# Patient Record
Sex: Male | Born: 1965 | Race: White | Hispanic: Yes | Smoking: Never smoker
Health system: Southern US, Community
[De-identification: ages and names within clinical notes are randomized; demographics above are authoritative.]

## PROBLEM LIST (undated history)

## (undated) DIAGNOSIS — E119 Type 2 diabetes mellitus without complications: Secondary | ICD-10-CM

## (undated) DIAGNOSIS — N419 Inflammatory disease of prostate, unspecified: Secondary | ICD-10-CM

## (undated) DIAGNOSIS — F1910 Other psychoactive substance abuse, uncomplicated: Secondary | ICD-10-CM

## (undated) DIAGNOSIS — F419 Anxiety disorder, unspecified: Secondary | ICD-10-CM

## (undated) DIAGNOSIS — F1490 Cocaine use, unspecified, uncomplicated: Secondary | ICD-10-CM

## (undated) DIAGNOSIS — K824 Cholesterolosis of gallbladder: Secondary | ICD-10-CM

## (undated) DIAGNOSIS — E785 Hyperlipidemia, unspecified: Secondary | ICD-10-CM

## (undated) DIAGNOSIS — N400 Enlarged prostate without lower urinary tract symptoms: Secondary | ICD-10-CM

## (undated) DIAGNOSIS — M1990 Unspecified osteoarthritis, unspecified site: Secondary | ICD-10-CM

## (undated) DIAGNOSIS — R7303 Prediabetes: Secondary | ICD-10-CM

## (undated) DIAGNOSIS — K219 Gastro-esophageal reflux disease without esophagitis: Secondary | ICD-10-CM

## (undated) HISTORY — DX: Inflammatory disease of prostate, unspecified: N41.9

## (undated) HISTORY — DX: Hyperlipidemia, unspecified: E78.5

## (undated) HISTORY — DX: Other psychoactive substance abuse, uncomplicated: F19.10

## (undated) HISTORY — DX: Gastro-esophageal reflux disease without esophagitis: K21.9

## (undated) HISTORY — DX: Prediabetes: R73.03

## (undated) HISTORY — DX: Unspecified osteoarthritis, unspecified site: M19.90

## (undated) HISTORY — DX: Cholesterolosis of gallbladder: K82.4

## (undated) HISTORY — DX: Cocaine use, unspecified, uncomplicated: F14.90

## (undated) HISTORY — DX: Type 2 diabetes mellitus without complications: E11.9

## (undated) HISTORY — DX: Benign prostatic hyperplasia without lower urinary tract symptoms: N40.0

## (undated) HISTORY — DX: Anxiety disorder, unspecified: F41.9

---

## 2005-05-23 ENCOUNTER — Emergency Department (HOSPITAL_COMMUNITY): Admission: EM | Admit: 2005-05-23 | Discharge: 2005-05-23 | Payer: Self-pay | Admitting: Emergency Medicine

## 2006-03-04 ENCOUNTER — Emergency Department (HOSPITAL_COMMUNITY): Admission: EM | Admit: 2006-03-04 | Discharge: 2006-03-04 | Payer: Self-pay | Admitting: Emergency Medicine

## 2011-08-08 ENCOUNTER — Other Ambulatory Visit: Payer: Self-pay | Admitting: Specialist

## 2011-08-08 DIAGNOSIS — R1011 Right upper quadrant pain: Secondary | ICD-10-CM

## 2011-08-11 ENCOUNTER — Other Ambulatory Visit: Payer: Self-pay

## 2011-08-14 ENCOUNTER — Ambulatory Visit
Admission: RE | Admit: 2011-08-14 | Discharge: 2011-08-14 | Disposition: A | Discharge status: Home / Self Care | Payer: No Typology Code available for payment source | Source: Ambulatory Visit | Attending: Specialist | Admitting: Specialist

## 2011-08-14 ENCOUNTER — Other Ambulatory Visit: Payer: Self-pay | Admitting: Specialist

## 2011-08-14 DIAGNOSIS — R1011 Right upper quadrant pain: Secondary | ICD-10-CM

## 2011-09-04 ENCOUNTER — Other Ambulatory Visit: Payer: Self-pay | Admitting: Specialist

## 2011-09-04 DIAGNOSIS — N4289 Other specified disorders of prostate: Secondary | ICD-10-CM

## 2011-09-05 ENCOUNTER — Other Ambulatory Visit: Payer: No Typology Code available for payment source

## 2011-09-08 ENCOUNTER — Other Ambulatory Visit: Payer: No Typology Code available for payment source

## 2011-09-15 ENCOUNTER — Other Ambulatory Visit: Payer: No Typology Code available for payment source

## 2011-09-18 ENCOUNTER — Other Ambulatory Visit (HOSPITAL_COMMUNITY): Payer: Self-pay | Admitting: Specialist

## 2011-09-18 ENCOUNTER — Other Ambulatory Visit: Payer: No Typology Code available for payment source

## 2011-10-06 ENCOUNTER — Other Ambulatory Visit (HOSPITAL_COMMUNITY): Payer: Self-pay | Admitting: Adult Health

## 2011-10-06 DIAGNOSIS — R1032 Left lower quadrant pain: Secondary | ICD-10-CM

## 2011-10-09 ENCOUNTER — Inpatient Hospital Stay (HOSPITAL_COMMUNITY): Admission: RE | Admit: 2011-10-09 | Payer: No Typology Code available for payment source | Source: Ambulatory Visit

## 2011-10-16 ENCOUNTER — Inpatient Hospital Stay (HOSPITAL_COMMUNITY)
Admission: RE | Admit: 2011-10-16 | Discharge: 2011-10-16 | Payer: No Typology Code available for payment source | Source: Ambulatory Visit | Attending: Adult Health | Admitting: Adult Health

## 2013-07-31 IMAGING — US US ABDOMEN LIMITED
1 series · 14 of 25 positions shown · non-contrast
Comparison: None.

CLINICAL DATA: Right upper quadrant pain

LIMITED ABDOMINAL ULTRASOUND - RIGHT UPPER QUADRANT

[Series 1: us abdomen limited · 0.17mm/px · 14 of 68 slices shown]
[im 1/68]
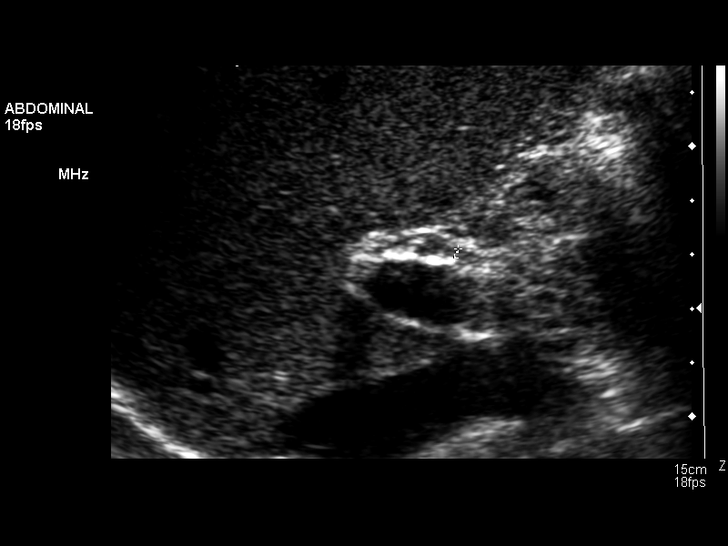
[im 6/68]
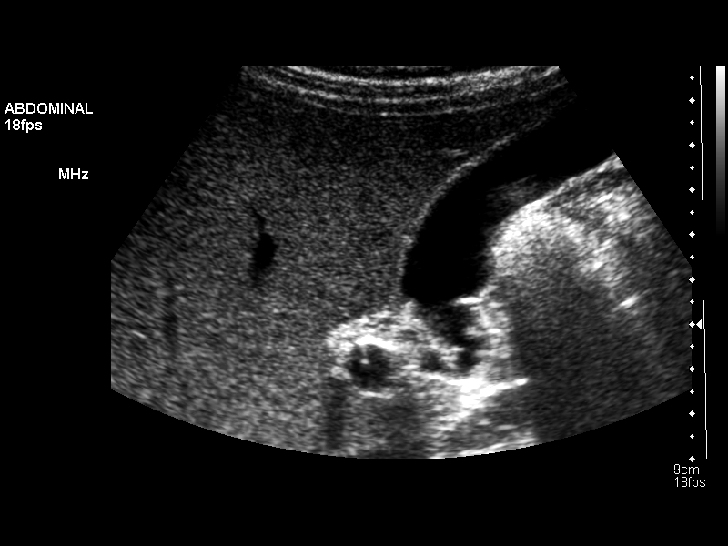
[im 12/68]
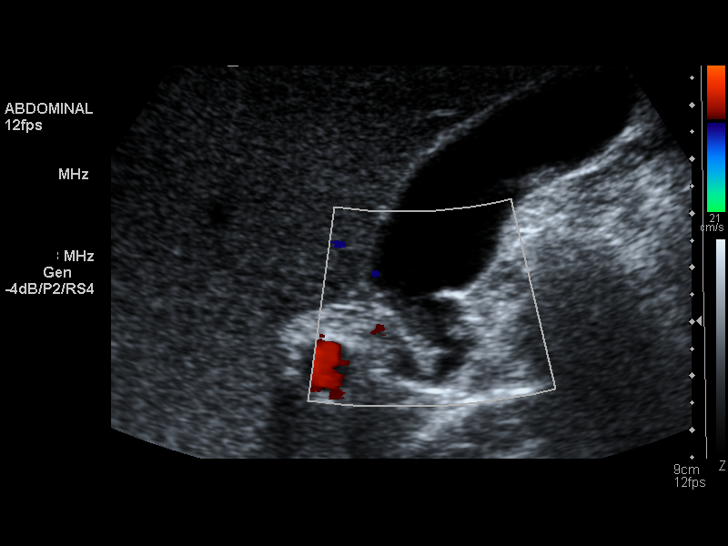
[im 17/68]
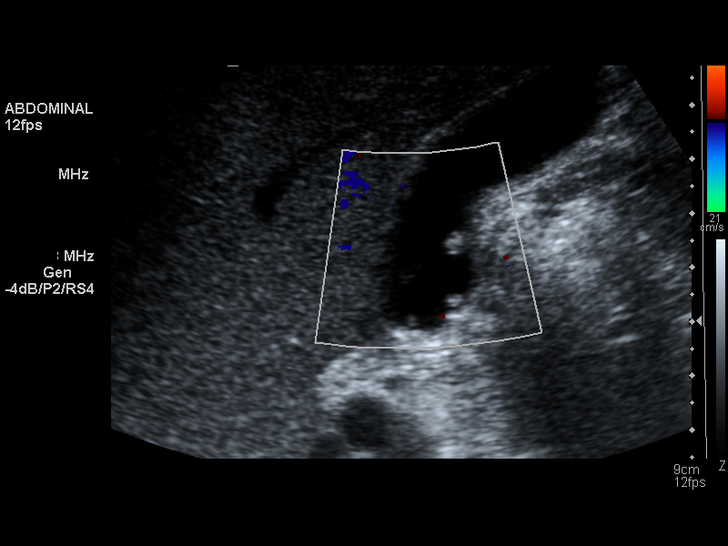
[im 23/68]
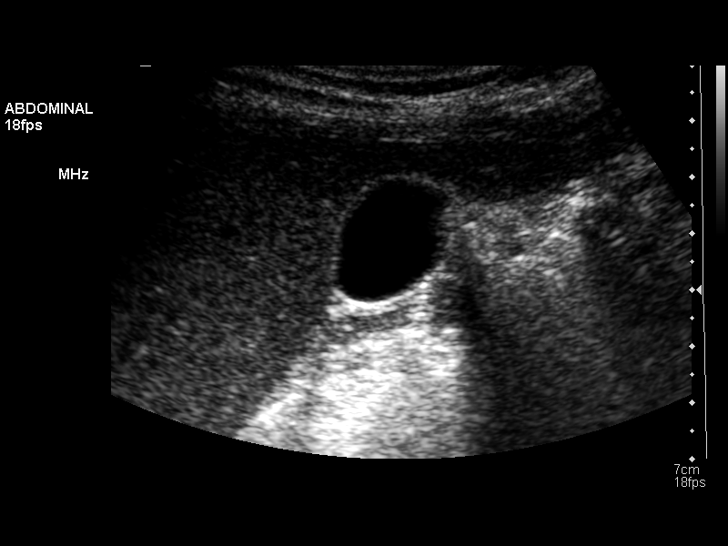
[im 26/68]
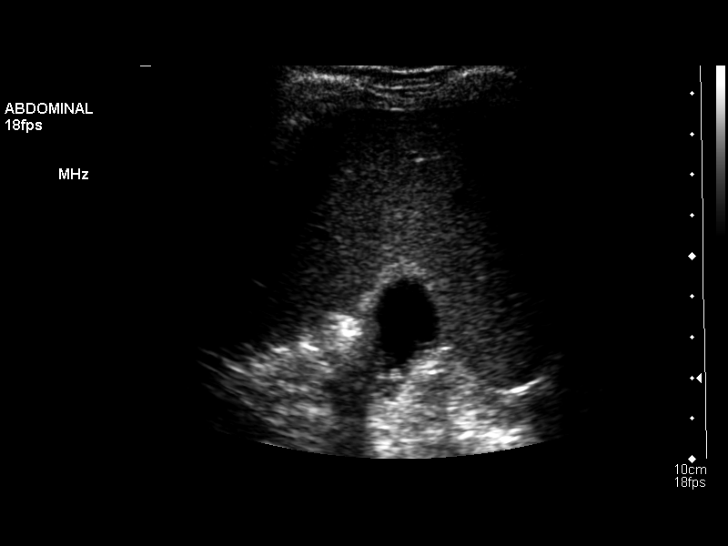
[im 31/68]
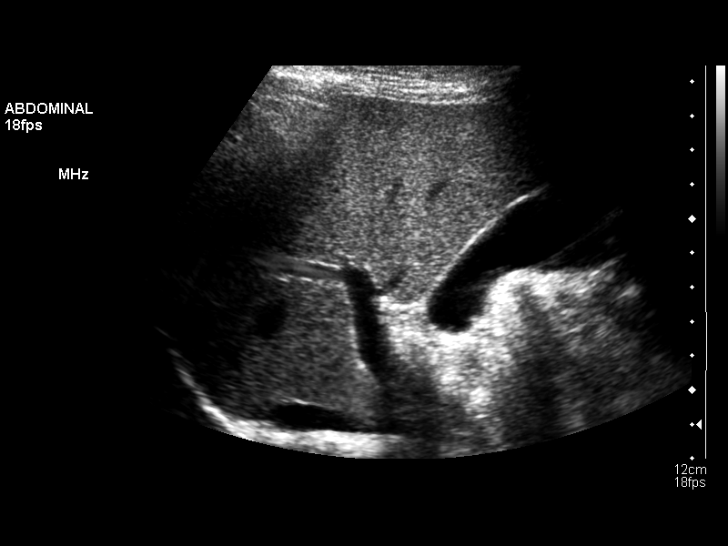
[im 37/68]
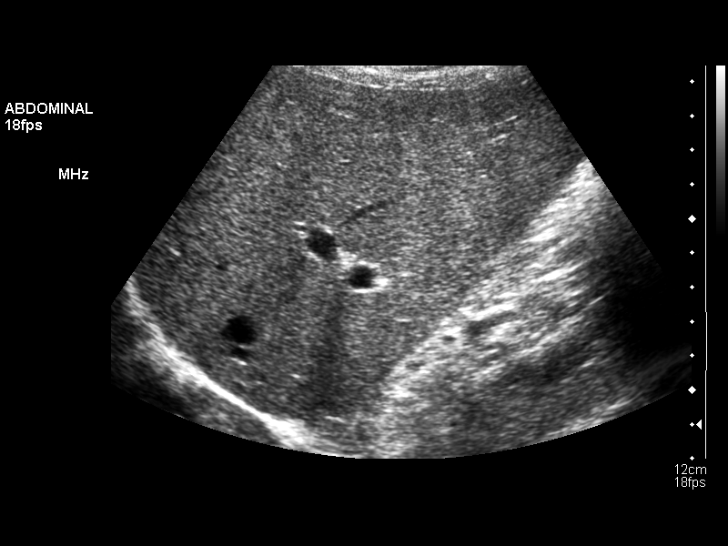
[im 42/68]
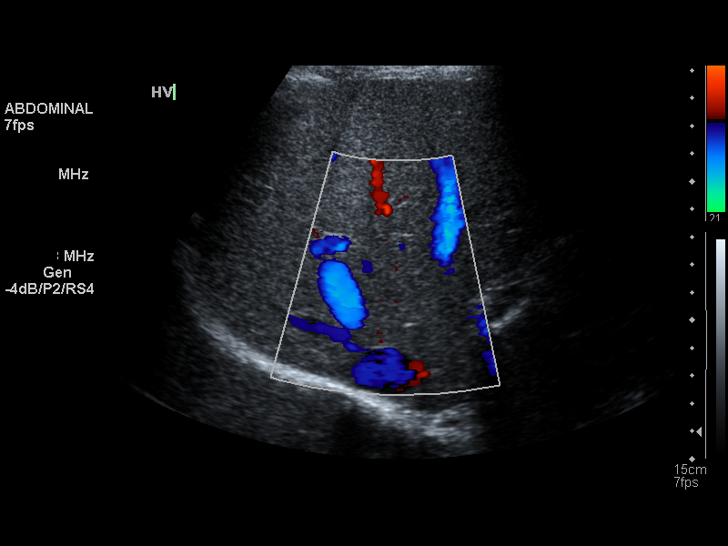
[im 45/68]
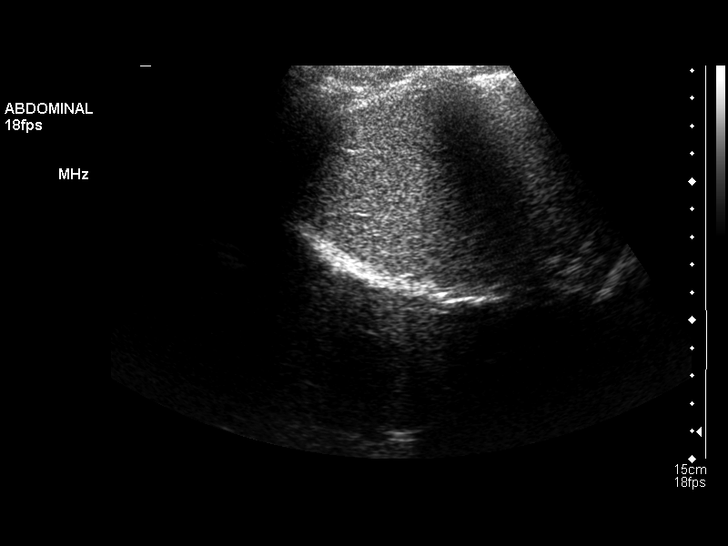
[im 51/68]
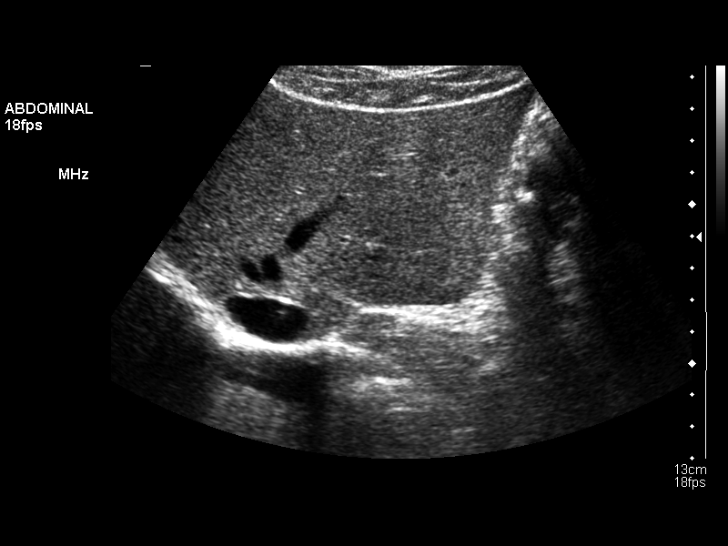
[im 56/68]
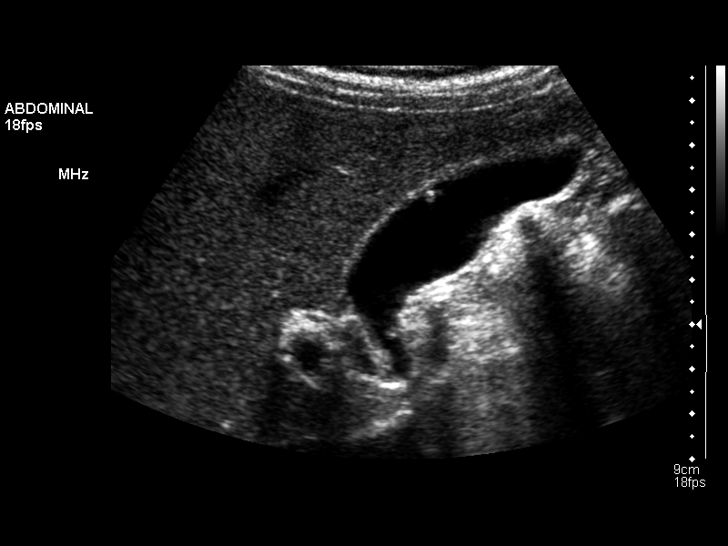
[im 62/68]
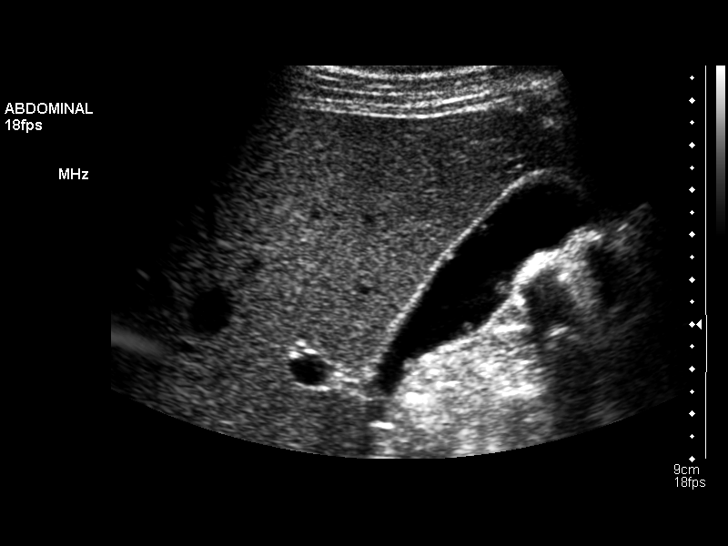
[im 68/68]
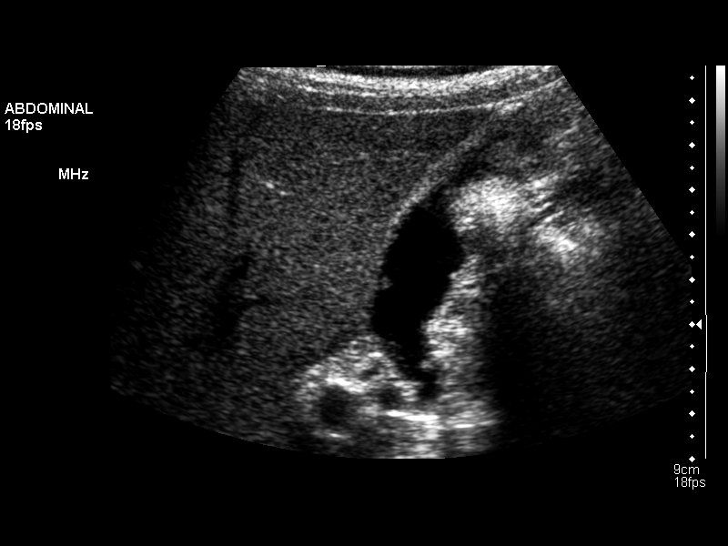

[14 of 25 positions shown; findings below may reference images not displayed]

FINDINGS: Gallbladder:  There are multiple nonshadowing non mobile echogenic
foci within the gallbladder which are most typical of polyps no
larger than 4 mm in diameter.  No gallstones are seen.  There is no
pain over the gallbladder with compression.

Common bile duct:  The common bile duct is normal measuring 1.8 mm
in diameter.

Liver:  The liver is only slightly inhomogeneous.  No focal hepatic
lesion is seen.
IMPRESSION: 1.  Multiple gallbladder polyps of no larger than 4 mm in diameter.
No gallstones or pain over the gallbladder.
2.  Slightly inhomogeneous liver.

## 2018-02-08 ENCOUNTER — Encounter: Payer: Self-pay | Admitting: Gastroenterology

## 2018-03-08 ENCOUNTER — Encounter: Payer: Self-pay | Admitting: Gastroenterology

## 2018-03-20 ENCOUNTER — Encounter: Payer: Self-pay | Admitting: Gastroenterology

## 2018-03-26 ENCOUNTER — Ambulatory Visit (AMBULATORY_SURGERY_CENTER): Payer: Self-pay

## 2018-03-26 VITALS — Ht 63.0 in | Wt 136.4 lb

## 2018-03-26 DIAGNOSIS — Z1211 Encounter for screening for malignant neoplasm of colon: Secondary | ICD-10-CM

## 2018-03-26 NOTE — Progress Notes (Signed)
Julia-the interpretor was with the pt for his PV prior to his colon on 04/09/18.  During the PV, the  pt states he has been having left lower abd pain, diarrhea for over 5 months and is complaining of a fever, dizziness and  nausea  today. The pt was being treated for a bladder infection with Septra, but stopped the medication over 3 weeks ago. Advised pt he needs to be evaluated by the doctor due to a possible infection. The pt wants to go back to see Dr Marcellina Millin PA. Colon on 04/09/18 was cx'd and the PV was not completed today. The pt understood. Gregary Signs, the interpretor informed pt to schedule a doctors appointment with his PCP. He will reschedule the colon after he has been medically cleared by his PCP. Gwyndolyn Saxon, RN

## 2018-04-09 ENCOUNTER — Encounter: Payer: Self-pay | Admitting: Gastroenterology

## 2018-10-13 ENCOUNTER — Emergency Department (HOSPITAL_COMMUNITY): Payer: Self-pay

## 2018-10-13 ENCOUNTER — Emergency Department (HOSPITAL_COMMUNITY)
Admission: EM | Admit: 2018-10-13 | Discharge: 2018-10-13 | Disposition: A | Discharge status: Home / Self Care | Payer: Self-pay | Attending: Emergency Medicine | Admitting: Emergency Medicine

## 2018-10-13 ENCOUNTER — Other Ambulatory Visit: Payer: Self-pay

## 2018-10-13 ENCOUNTER — Encounter (HOSPITAL_COMMUNITY): Payer: Self-pay | Admitting: Student

## 2018-10-13 DIAGNOSIS — R42 Dizziness and giddiness: Secondary | ICD-10-CM | POA: Insufficient documentation

## 2018-10-13 DIAGNOSIS — F1410 Cocaine abuse, uncomplicated: Secondary | ICD-10-CM | POA: Insufficient documentation

## 2018-10-13 DIAGNOSIS — R0600 Dyspnea, unspecified: Secondary | ICD-10-CM | POA: Insufficient documentation

## 2018-10-13 DIAGNOSIS — R002 Palpitations: Secondary | ICD-10-CM | POA: Insufficient documentation

## 2018-10-13 LAB — CBC
HEMATOCRIT: 46.5 % (ref 39.0–52.0)
HEMOGLOBIN: 15.1 g/dL (ref 13.0–17.0)
MCH: 29.7 pg (ref 26.0–34.0)
MCHC: 32.5 g/dL (ref 30.0–36.0)
MCV: 91.4 fL (ref 80.0–100.0)
NRBC: 0 % (ref 0.0–0.2)
Platelets: 276 10*3/uL (ref 150–400)
RBC: 5.09 MIL/uL (ref 4.22–5.81)
RDW: 13.6 % (ref 11.5–15.5)
WBC: 9.9 10*3/uL (ref 4.0–10.5)

## 2018-10-13 LAB — BASIC METABOLIC PANEL
Anion gap: 8 (ref 5–15)
BUN: 9 mg/dL (ref 6–20)
CALCIUM: 8.7 mg/dL — AB (ref 8.9–10.3)
CO2: 23 mmol/L (ref 22–32)
Chloride: 106 mmol/L (ref 98–111)
Creatinine, Ser: 0.92 mg/dL (ref 0.61–1.24)
GFR calc non Af Amer: >60 mL/min (ref >60)
GLUCOSE: 107 mg/dL — AB (ref 70–99)
Potassium: 3.9 mmol/L (ref 3.5–5.1)
Sodium: 137 mmol/L (ref 135–145)

## 2018-10-13 MED ORDER — SODIUM CHLORIDE 0.9 % IV BOLUS
1000.0000 mL | Freq: Once | INTRAVENOUS | Status: AC
  Administered 2018-10-13: 1000 mL via INTRAVENOUS

## 2018-10-13 NOTE — Discharge Instructions (Addendum)
Please do not use alcohol or cocaine or other illicit drugs Your work  up here is normal Recheck with your doctor as needed

## 2018-10-13 NOTE — ED Triage Notes (Signed)
Per ems, pt was driving, became SOB and felt heart fluttering. Pulled over and called 911. PT also reports  Tingling to right and left upper extremities

## 2018-10-17 NOTE — ED Provider Notes (Signed)
[facility] DEPARTMENT Provider Note   CSN: [id number] Arrival date & time: 10/13/18  1526    History   Chief Complaint Chief Complaint  Patient presents with  . Shortness of Breath    HPI Anthony Stanley is a 53 y.o. male.     HPI 53 yo male brought in by ems with complaints of palpitations while driving.  He felt like his heart was racing with some associated dyspnea.  He pulled over and felt lightheaded.  He has had some episodes in the past but they resolved prior to seeking medical care. EMS was called and transported patient.  Patient denies chest pain, cough, exposure to covid or travel. He endorses alcohol and cocaine last night, but not today.  This has affected his ability to eat and drink.  History reviewed. No pertinent past medical history.  There are no active problems to display for this patient.   History reviewed. No pertinent surgical history.      Home Medications    Prior to Admission medications   Not on File    Family History History reviewed. No pertinent family history.  Social History Social History   Tobacco Use  . Smoking status: Never Smoker  . Smokeless tobacco: Never Used  Substance Use Topics  . Alcohol use: Yes    Alcohol/week: 10.0 standard drinks    Types: 10 Cans of beer per week  . Drug use: Not on file     Allergies   Patient has no known allergies.   Review of Systems Review of Systems  All other systems reviewed and are negative.    Physical Exam Updated Vital Signs BP 131/88 (BP Location: Right Arm)   Pulse 82   Temp 98.4 F (36.9 C) (Oral)   Resp 16   Ht 1.651 m (5\' 5" )   Wt 61.2 kg   SpO2 98%   BMI 22.47 kg/m   Physical Exam Vitals signs and nursing note reviewed.  Constitutional:      Appearance: He is well-developed.  HENT:     Head: Normocephalic.     Mouth/Throat:     Mouth: Mucous membranes are moist.  Eyes:     Pupils: Pupils are equal, round, and  reactive to light.  Neck:     Musculoskeletal: Normal range of motion.  Cardiovascular:     Rate and Rhythm: Normal rate and regular rhythm.  Pulmonary:     Effort: Pulmonary effort is normal.  Abdominal:     Palpations: Abdomen is soft.  Musculoskeletal: Normal range of motion.  Skin:    General: Skin is warm and dry.  Neurological:     General: No focal deficit present.     Mental Status: He is alert.  Psychiatric:        Mood and Affect: Mood normal.      ED Treatments / Results  Labs (all labs ordered are listed, but only abnormal results are displayed) Labs Reviewed  BASIC METABOLIC PANEL - Abnormal; Notable for the following components:      Result Value   Glucose, Bld 107 (*)    Calcium 8.7 (*)    All other components within normal limits  CBC    EKG EKG Interpretation  Date/Time:  Sunday October 13 2018 15:34:46 EDT Ventricular Rate:  87 PR Interval:  138 QRS Duration: 76 QT Interval:  352 QTC Calculation: 423 R Axis:   25 Text Interpretation:  Normal sinus rhythm Normal ECG Confirmed by Jaydence Vanyo,  Andee Poles [phone]) on 10/13/2018 3:56:36 PM   Radiology No results found.  Procedures Procedures (including critical care time)  Medications Ordered in ED Medications  sodium chloride 0.9 % bolus 1,000 mL (0 mLs Intravenous Stopped 10/13/18 1757)     Initial Impression / Assessment and Plan / ED Course  I have reviewed the triage vital signs and the nursing notes.  Pertinent labs & imaging results that were available during my care of the patient were reviewed by me and considered in my medical decision making (see chart for details).         Final Clinical Impressions(s) / ED Diagnoses   Final diagnoses:  Palpitations  Cocaine abuse [facility])    ED Discharge Orders    None       Pattricia Boss, MD 10/17/18 1038

## 2019-06-11 ENCOUNTER — Encounter: Payer: Self-pay | Admitting: Physician Assistant

## 2019-06-18 ENCOUNTER — Encounter: Payer: Self-pay | Admitting: *Deleted

## 2019-06-27 ENCOUNTER — Ambulatory Visit (INDEPENDENT_AMBULATORY_CARE_PROVIDER_SITE_OTHER): Payer: Self-pay | Admitting: Physician Assistant

## 2019-06-27 ENCOUNTER — Other Ambulatory Visit (INDEPENDENT_AMBULATORY_CARE_PROVIDER_SITE_OTHER): Payer: Self-pay

## 2019-06-27 ENCOUNTER — Ambulatory Visit (INDEPENDENT_AMBULATORY_CARE_PROVIDER_SITE_OTHER): Payer: Self-pay

## 2019-06-27 ENCOUNTER — Encounter: Payer: Self-pay | Admitting: Physician Assistant

## 2019-06-27 ENCOUNTER — Other Ambulatory Visit: Payer: Self-pay

## 2019-06-27 VITALS — BP 130/90 | HR 90 | Temp 98.5°F | Ht 62.0 in | Wt 126.0 lb

## 2019-06-27 DIAGNOSIS — Z1159 Encounter for screening for other viral diseases: Secondary | ICD-10-CM

## 2019-06-27 DIAGNOSIS — K5900 Constipation, unspecified: Secondary | ICD-10-CM

## 2019-06-27 DIAGNOSIS — R1032 Left lower quadrant pain: Secondary | ICD-10-CM

## 2019-06-27 DIAGNOSIS — Z1211 Encounter for screening for malignant neoplasm of colon: Secondary | ICD-10-CM

## 2019-06-27 LAB — CBC WITH DIFFERENTIAL/PLATELET
Basophils Absolute: 0 10*3/uL (ref 0.0–0.1)
Basophils Relative: 0.5 % (ref 0.0–3.0)
Eosinophils Absolute: 0 10*3/uL (ref 0.0–0.7)
Eosinophils Relative: 0.6 % (ref 0.0–5.0)
HCT: 48.6 % (ref 39.0–52.0)
Hemoglobin: 16.4 g/dL (ref 13.0–17.0)
Lymphocytes Relative: 23.7 % (ref 12.0–46.0)
Lymphs Abs: 1.9 10*3/uL (ref 0.7–4.0)
MCHC: 33.7 g/dL (ref 30.0–36.0)
MCV: 91.2 fl (ref 78.0–100.0)
Monocytes Absolute: 0.4 10*3/uL (ref 0.1–1.0)
Monocytes Relative: 5.6 % (ref 3.0–12.0)
Neutro Abs: 5.5 10*3/uL (ref 1.4–7.7)
Neutrophils Relative %: 69.6 % (ref 43.0–77.0)
Platelets: 273 10*3/uL (ref 150.0–400.0)
RBC: 5.33 Mil/uL (ref 4.22–5.81)
RDW: 13.5 % (ref 11.5–15.5)
WBC: 7.8 10*3/uL (ref 4.0–10.5)

## 2019-06-27 LAB — COMPREHENSIVE METABOLIC PANEL
ALT: 28 U/L (ref 0–53)
AST: 17 U/L (ref 0–37)
Albumin: 4.6 g/dL (ref 3.5–5.2)
Alkaline Phosphatase: 76 U/L (ref 39–117)
BUN: 16 mg/dL (ref 6–23)
CO2: 26 mEq/L (ref 19–32)
Calcium: 9.6 mg/dL (ref 8.4–10.5)
Chloride: 103 mEq/L (ref 96–112)
Creatinine, Ser: 0.85 mg/dL (ref 0.40–1.50)
GFR: 94.49 mL/min (ref >60.00)
Glucose, Bld: 103 mg/dL — ABNORMAL HIGH (ref 70–99)
Potassium: 4.3 mEq/L (ref 3.5–5.1)
Sodium: 139 mEq/L (ref 135–145)
Total Bilirubin: 0.5 mg/dL (ref 0.2–1.2)
Total Protein: 7.9 g/dL (ref 6.0–8.3)

## 2019-06-27 LAB — SARS CORONAVIRUS 2 (TAT 6-24 HRS): SARS Coronavirus 2: NEGATIVE

## 2019-06-27 NOTE — Patient Instructions (Addendum)
You have been scheduled for a colonoscopy. Please follow written instructions given to you at your visit today.  Please pick up your prep supplies at the pharmacy within the next 1-3 days. If you use inhalers (even only as needed), please bring them with you on the day of your procedure.  Please purchase the following medications over the counter and take as directed: Miralax 17 grams (1 capful) dissolved in 8 ounces of water/juice once daily for bowels  Your provider has requested that you go to the basement level for lab work before leaving today. Press "B" on the elevator. The lab is located at the first door on the left as you exit the elevator.  If you are age 80 or older, your body mass index should be between 23-30. Your Body mass index is 23.05 kg/m. If this is out of the aforementioned range listed, please consider follow up with your Primary Care Provider.  If you are age 72 or younger, your body mass index should be between 19-25. Your Body mass index is 23.05 kg/m. If this is out of the aformentioned range listed, please consider follow up with your Primary Care Provider.  ________________________________________________________________ Anthony Stanley le ha programado una colonoscopia. Siga las instrucciones escritas que se le dieron en su visita de hoy. Recoja sus suministros de preparacin en la farmacia dentro de los prximos 1-3 das. Si [place] inhaladores (aunque solo sea necesario), trigalos el da de su procedimiento.  Compre los siguientes medicamentos sin receta y tmelos segn las indicaciones: Miralax 17 gramos (1 tapn) disueltos en 8 onzas de agua / jugo una vez al da para los intestinos  Su proveedor ha solicitado que vaya al nivel del stano para realizar anlisis de laboratorio antes de salir hoy. Presione "B" en el ascensor. El laboratorio est ubicado en la primera puerta a la izquierda al salir del Materials engineer.  Si tiene 65 aos o ms, su ndice de [employer] corporal debe estar  entre 23-30. Su ndice de masa corporal es de 23,05 kg / m. Si esto est fuera del rango mencionado anteriormente, considere Optometrist un seguimiento con su Proveedor de Midwife.  Si tiene 39 aos o menos, su ndice de [employer] corporal debe estar entre 19-25. Su ndice de masa corporal es de 23,05 kg / m. Si esto est fuera del rango mencionado anteriormente, considere hacer un seguimiento con su Proveedor de Midwife.

## 2019-06-27 NOTE — Progress Notes (Signed)
Subjective:    Patient ID: Anthony Stanley, male    DOB: 02-Jan-1966, 53 y.o.   MRN: [id number]  HPI [name] is a pleasant 53 year old Hispanic male, new to GI today, self-referred for evaluation of constipation and lower abdominal pain.  Patient has not had any prior GI evaluation. He says symptoms initially started about 2 years ago with intermittent left lower abdominal discomfort.  Over the past 3 to 4 weeks he has had more constant discomfort in the left lower quadrant which she describes as pressure-like or bloating in nature.  This is not been severe.  No associated fever or chills.  Appetite has been okay, weight has been stable.  He does feel that his abdominal discomfort is worse postprandially if he eats a lot.  He is also developed constipation and may go a couple of days between bowel movements.  Occasionally also has loose stools he has not noted any melena or hematochezia. No prior abdominal surgery. Family history negative for colon cancer polyps and IBD as far as he is aware, does not know paternal history. He had been given Colace to take for constipation but has not found that helpful.  Review of Systems Pertinent positive and negative review of systems were noted in the above HPI section.  All other review of systems was otherwise negative.  Outpatient Encounter Medications as of 06/27/2019  Medication Sig  . meclizine (ANTIVERT) 25 MG tablet Take 25 mg by mouth 3 (three) times daily as needed for dizziness.  . metFORMIN (GLUCOPHAGE) 500 MG tablet Take 500 mg by mouth 2 (two) times daily with a meal.  . sennosides-docusate sodium (SENOKOT-S) 8.6-50 MG tablet Take 2 tablets by mouth daily.   No facility-administered encounter medications on file as of 06/27/2019.    No Known Allergies There are no active problems to display for this patient.  Social History   Socioeconomic History  . Marital status: Legally Separated    Spouse name: Not on file  . Number of children:  Not on file  . Years of education: Not on file  . Highest education level: Not on file  Occupational History  . Not on file  Social Needs  . Financial resource strain: Not on file  . Food insecurity    Worry: Not on file    Inability: Not on file  . Transportation needs    Medical: Not on file    Non-medical: Not on file  Tobacco Use  . Smoking status: Never Smoker  . Smokeless tobacco: Never Used  Substance and Sexual Activity  . Alcohol use: Yes    Alcohol/week: 10.0 standard drinks    Types: 10 Cans of beer per week  . Drug use: Yes    Types: Cocaine    Comment: once in a while  . Sexual activity: Not on file  Lifestyle  . Physical activity    Days per week: Not on file    Minutes per session: Not on file  . Stress: Not on file  Relationships  . Social Herbalist on phone: Not on file    Gets together: Not on file    Attends religious service: Not on file    Active member of club or organization: Not on file    Attends meetings of clubs or organizations: Not on file    Relationship status: Not on file  . Intimate partner violence    Fear of current or ex partner: Not on file  Emotionally abused: Not on file    Physically abused: Not on file    Forced sexual activity: Not on file  Other Topics Concern  . Not on file  Social History Narrative  . Not on file    Mr. Hessling family history includes Heart attack in his maternal grandmother; Hypertension in his paternal grandmother; Prostate cancer in his maternal grandfather.      Objective:    Vitals:   06/27/19 0923  BP: 130/90  Pulse: 90  Temp: 98.5 F (36.9 C)    Physical Exam Well-developed well-nourished Hispanic male in no acute distress.Marland Kitchen  He is accompanied by an interpreter to understand some [place].  Height, Weight 126, BMI 23.0  HEENT; nontraumatic normocephalic, EOMI, PER R LA, sclera anicteric. Oropharynx; not examined/mask/Covid Neck; supple, no JVD Cardiovascular;  regular rate and rhythm with S1-S2, no murmur rub or gallop Pulmonary; Clear bilaterally Abdomen; soft, , nondistended, he is mildly tender in the left lower quadrant no guarding or rebound no palpable mass, no palpable mass or hepatosplenomegaly, bowel sounds are active Rectal; not done today Skin; benign exam, no jaundice rash or appreciable lesions Extremities; no clubbing cyanosis or edema skin warm and dry Neuro/Psych; alert and oriented x4, grossly nonfocal mood and affect appropriate       Assessment & Plan:   #2 53 year old Hispanic male with 2-year history of constipation and intermittent lower abdominal discomfort who comes in now with 3 to 4-week history of more constant lower abdominal discomfort.  He is mildly tender in the left lower quadrant. Etiology of symptoms is not clear, rule out colon neoplasm, rule out symptomatic diverticular disease, rule out functional constipation/IBS  Plan; CBC and c-Met Start MiraLAX 17 g in 8 ounces of water on a daily basis, if he finds this to be too much can back off to every other day dosing. Patient be scheduled for colonoscopy with Dr. Ardis Hughs.  Procedure was discussed in detail with the patient including indications risks and benefits and he is agreeable to proceed. Further work-up pending results of above.  Amy S Esterwood PA-C 06/27/2019   Cc: No ref. provider found

## 2019-06-30 NOTE — Progress Notes (Signed)
I agree with the above note, plan 

## 2019-07-01 ENCOUNTER — Ambulatory Visit (AMBULATORY_SURGERY_CENTER): Payer: Self-pay | Admitting: Gastroenterology

## 2019-07-01 ENCOUNTER — Other Ambulatory Visit: Payer: Self-pay

## 2019-07-01 ENCOUNTER — Encounter: Payer: Self-pay | Admitting: Gastroenterology

## 2019-07-01 VITALS — BP 110/76 | HR 88 | Temp 98.1°F | Resp 19

## 2019-07-01 DIAGNOSIS — D123 Benign neoplasm of transverse colon: Secondary | ICD-10-CM

## 2019-07-01 DIAGNOSIS — K5730 Diverticulosis of large intestine without perforation or abscess without bleeding: Secondary | ICD-10-CM

## 2019-07-01 DIAGNOSIS — K5900 Constipation, unspecified: Secondary | ICD-10-CM

## 2019-07-01 MED ORDER — SODIUM CHLORIDE 0.9 % IV SOLN: 500.0000 mL | Freq: Once | INTRAVENOUS | Status: AC

## 2019-07-01 NOTE — Op Note (Signed)
[facility] Patient Name: Anthony Stanley Procedure Date: 07/01/2019 1:04 PM MRN: [id number] Endoscopist: Milus Banister , MD Age: 53 Referring MD:  Date of Birth: [birth year] Gender: Male Account #: [id number] Procedure:                Colonoscopy Indications:              Abdominal pain in the left lower quadrant Medicines:                Monitored Anesthesia Care Procedure:                Pre-Anesthesia Assessment:                           - Prior to the procedure, a History and Physical                            was performed, and patient medications and                            allergies were reviewed. The patient's tolerance of                            previous anesthesia was also reviewed. The risks                            and benefits of the procedure and the sedation                            options and risks were discussed with the patient.                            All questions were answered, and informed consent                            was obtained. Prior Anticoagulants: The patient has                            taken no previous anticoagulant or antiplatelet                            agents. ASA Grade Assessment: III - A patient with                            severe systemic disease. After reviewing the risks                            and benefits, the patient was deemed in                            satisfactory condition to undergo the procedure.                           After obtaining informed consent, the colonoscope  was passed under direct vision. Throughout the                            procedure, the patient's blood pressure, pulse, and                            oxygen saturations were monitored continuously. The                            Colonoscope was introduced through the anus and                            advanced to the the cecum, identified by                            appendiceal orifice  and ileocecal valve. The                            colonoscopy was performed without difficulty. The                            patient tolerated the procedure well. The quality                            of the bowel preparation was good. The ileocecal                            valve, appendiceal orifice, and rectum were                            photographed. Scope In: 1:23:12 PM Scope Out: 1:34:03 PM Scope Withdrawal Time: 0 hours 9 minutes 6 seconds  Total Procedure Duration: 0 hours 10 minutes 51 seconds  Findings:                 A 5 mm polyp was found in the transverse colon. The                            polyp was sessile. The polyp was removed with a                            cold snare. Resection and retrieval were complete.                           A few small-mouthed diverticula were found in the                            entire colon.                           The exam was otherwise without abnormality on                            direct and retroflexion views. Complications:  No immediate complications. Estimated blood loss:                            None. Estimated Blood Loss:     Estimated blood loss: none. Impression:               - One 5 mm polyp in the transverse colon, removed                            with a cold snare. Resected and retrieved.                           - Diverticulosis in the entire examined colon.                           - The examination was otherwise normal on direct                            and retroflexion views. Recommendation:           - Patient has a contact number available for                            emergencies. The signs and symptoms of potential                            delayed complications were discussed with the                            patient. Return to normal activities tomorrow.                            Written discharge instructions were provided to the                            patient.                            - Resume previous diet.                           - Continue present medications. Continue the daily                            miralax, it will help your constipation which in                            turn should help your abdominal pains.                           - Await pathology results. Milus Banister, MD 07/01/2019 1:38:12 PM This report has been signed electronically.

## 2019-07-01 NOTE — Progress Notes (Signed)
A and O x3. Report to RN. Tolerated MAC anesthesia well.

## 2019-07-01 NOTE — Patient Instructions (Signed)
YOU HAD AN ENDOSCOPIC PROCEDURE TODAY AT [facility]:   Refer to the procedure report that was given to you for any specific questions about what was found during the examination.  If the procedure report does not answer your questions, please call your gastroenterologist to clarify.  If you requested that your care partner not be given the details of your procedure findings, then the procedure report has been included in a sealed envelope for you to review at your convenience later.  YOU SHOULD EXPECT: Some feelings of bloating in the abdomen. Passage of more gas than usual.  Walking can help get rid of the air that was put into your GI tract during the procedure and reduce the bloating. If you had a lower endoscopy (such as a colonoscopy or flexible sigmoidoscopy) you may notice spotting of blood in your stool or on the toilet paper. If you underwent a bowel prep for your procedure, you may not have a normal bowel movement for a few days.  Please Note:  You might notice some irritation and congestion in your nose or some drainage.  This is from the oxygen used during your procedure.  There is no need for concern and it should clear up in a day or so.  SYMPTOMS TO REPORT IMMEDIATELY:   Following lower endoscopy (colonoscopy or flexible sigmoidoscopy):  Excessive amounts of blood in the stool  Significant tenderness or worsening of abdominal pains  Swelling of the abdomen that is new, acute  Fever of 100F or higher   For urgent or emergent issues, a gastroenterologist can be reached at any hour by calling [phone].   DIET:  We do recommend a small meal at first, but then you may proceed to your regular diet.  Drink plenty of fluids but you should avoid alcoholic beverages for 24 hours.  ACTIVITY:  You should plan to take it easy for the rest of today and you should NOT DRIVE or use heavy machinery until tomorrow (because of the sedation medicines used during the test).     FOLLOW UP: Our staff will call the number listed on your records 48-72 hours following your procedure to check on you and address any questions or concerns that you may have regarding the information given to you following your procedure. If we do not reach you, we will leave a message.  We will attempt to reach you two times.  During this call, we will ask if you have developed any symptoms of COVID 19. If you develop any symptoms (ie: fever, flu-like symptoms, shortness of breath, cough etc.) before then, please call [phone].  If you test positive for Covid 19 in the 2 weeks post procedure, please call and report this information to [place].    If any biopsies were taken you will be contacted by phone or by letter within the next 1-3 weeks.  Please call us at [phone] if you have not heard about the biopsies in 3 weeks.    SIGNATURES/CONFIDENTIALITY: You and/or your care partner have signed paperwork which will be entered into your electronic medical record.  These signatures attest to the fact that that the information above on your After Visit Summary has been reviewed and is understood.  Full responsibility of the confidentiality of this discharge information lies with you and/or your care-partner.   Resume medications. Information given on polyps and diverticulosis.

## 2019-07-01 NOTE — Progress Notes (Signed)
Cw vitals, Cm iv and LC temps.

## 2019-07-01 NOTE — Progress Notes (Signed)
Called to room to assist during endoscopic procedure.  Patient ID and intended procedure confirmed with present staff. Received instructions for my participation in the procedure from the performing physician.  

## 2019-07-03 ENCOUNTER — Telehealth: Payer: Self-pay | Admitting: *Deleted

## 2019-07-03 NOTE — Telephone Encounter (Signed)
  Follow up Call-  Call back number 07/01/2019  Post procedure Call Back phone  # [phone]  Permission to leave phone message Yes  Some recent data might be hidden     Patient questions:  Do you have a fever, pain , or abdominal swelling? No. Pain Score  0 *  Have you tolerated food without any problems? Yes.    Have you been able to return to your normal activities? Yes.    Do you have any questions about your discharge instructions: Diet   No. Medications  No. Follow up visit  No.  Do you have questions or concerns about your Care? No.  Actions: * If pain score is 4 or above: 1. No action needed, pain <4.Have you developed a fever since your procedure? no  2.   Have you had an respiratory symptoms (SOB or cough) since your procedure? no  3.   Have you tested positive for COVID 19 since your procedure no  4.   Have you had any family members/close contacts diagnosed with the COVID 19 since your procedure?  no   If yes to any of these questions please route to Joylene John, RN and Alphonsa Gin, Therapist, sports.

## 2019-07-07 ENCOUNTER — Encounter: Payer: Self-pay | Admitting: Gastroenterology

## 2020-04-18 ENCOUNTER — Encounter (HOSPITAL_COMMUNITY): Payer: Self-pay | Admitting: Emergency Medicine

## 2020-04-18 ENCOUNTER — Emergency Department (HOSPITAL_COMMUNITY): Payer: Self-pay

## 2020-04-18 ENCOUNTER — Other Ambulatory Visit: Payer: Self-pay

## 2020-04-18 ENCOUNTER — Emergency Department (HOSPITAL_COMMUNITY)
Admission: EM | Admit: 2020-04-18 | Discharge: 2020-04-18 | Disposition: A | Discharge status: Home / Self Care | Payer: Self-pay | Attending: Emergency Medicine | Admitting: Emergency Medicine

## 2020-04-18 DIAGNOSIS — R0789 Other chest pain: Secondary | ICD-10-CM | POA: Insufficient documentation

## 2020-04-18 DIAGNOSIS — E119 Type 2 diabetes mellitus without complications: Secondary | ICD-10-CM | POA: Insufficient documentation

## 2020-04-18 DIAGNOSIS — Z7984 Long term (current) use of oral hypoglycemic drugs: Secondary | ICD-10-CM | POA: Insufficient documentation

## 2020-04-18 LAB — CBC
HCT: 45.7 % (ref 39.0–52.0)
Hemoglobin: 15.5 g/dL (ref 13.0–17.0)
MCH: 31.1 pg (ref 26.0–34.0)
MCHC: 33.9 g/dL (ref 30.0–36.0)
MCV: 91.6 fL (ref 80.0–100.0)
Platelets: 270 10*3/uL (ref 150–400)
RBC: 4.99 MIL/uL (ref 4.22–5.81)
RDW: 13.9 % (ref 11.5–15.5)
WBC: 9.2 10*3/uL (ref 4.0–10.5)
nRBC: 0 % (ref 0.0–0.2)

## 2020-04-18 LAB — BASIC METABOLIC PANEL
Anion gap: 8 (ref 5–15)
BUN: 9 mg/dL (ref 6–20)
CO2: 27 mmol/L (ref 22–32)
Calcium: 8.9 mg/dL (ref 8.9–10.3)
Chloride: 104 mmol/L (ref 98–111)
Creatinine, Ser: 1.02 mg/dL (ref 0.61–1.24)
GFR calc Af Amer: >60 mL/min (ref >60)
GFR calc non Af Amer: >60 mL/min (ref >60)
Glucose, Bld: 77 mg/dL (ref 70–99)
Potassium: 4.4 mmol/L (ref 3.5–5.1)
Sodium: 139 mmol/L (ref 135–145)

## 2020-04-18 LAB — TROPONIN I (HIGH SENSITIVITY)
Troponin I (High Sensitivity): <2 ng/L (ref <18)
Troponin I (High Sensitivity): 2 ng/L (ref <18)

## 2020-04-18 LAB — ETHANOL: Alcohol, Ethyl (B): <10 mg/dL (ref <10)

## 2020-04-18 LAB — CK: Total CK: 132 U/L (ref 49–397)

## 2020-04-18 MED ORDER — SODIUM CHLORIDE 0.9 % IV BOLUS
1000.0000 mL | Freq: Once | INTRAVENOUS | Status: AC
  Administered 2020-04-18: 1000 mL via INTRAVENOUS

## 2020-04-18 NOTE — ED Provider Notes (Signed)
[facility] DEPT Provider Note   CSN: [id number] Arrival date & time: 04/18/20  1259     History Chief Complaint  Patient presents with  . Chest Pain    Anthony Stanley is a 54 y.o. male.  HPI    Patient presents with concern of an episode of bilateral hand shaking and lip numbness.  Episode began about 1 hour prior to ED arrival, has stopped prior to my evaluation.  He currently has no pain, complaints, discomfort, weakness. Patient knowledges drinking more than usual yesterday, eight beers. He denies other ingestants. He woke in his usual state of health, had no cooperative in for his episode, and did nothing in particular for symptoms to resolve.  Past Medical History:  Diagnosis Date  . Anxiety   . Arthritis   . BPH (benign prostatic hyperplasia)   . Cocaine use    per Dr Cindie Laroche office note [facility])  . Diabetes mellitus without complication ([place])   . Gallbladder polyp   . GERD (gastroesophageal reflux disease)   . Hyperlipidemia   . Prediabetes   . Prostatitis   . Substance abuse ([place])     There are no problems to display for this patient.   History reviewed. No pertinent surgical history.     Family History  Problem Relation Age of Onset  . Heart attack Maternal Grandmother   . Prostate cancer Maternal Grandfather   . Hypertension Paternal Grandmother   . Stomach cancer Neg Hx   . Esophageal cancer Neg Hx   . Colon cancer Neg Hx   . Rectal cancer Neg Hx     Social History   Tobacco Use  . Smoking status: Never Smoker  . Smokeless tobacco: Never Used  Substance Use Topics  . Alcohol use: Yes    Alcohol/week: 10.0 standard drinks    Types: 10 Cans of beer per week  . Drug use: Not Currently    Types: Cocaine    Comment: once in a while    Home Medications Prior to Admission medications   Medication Sig Start Date End Date Taking? Authorizing Provider  meclizine (ANTIVERT) 25 MG tablet  Take 25 mg by mouth 3 (three) times daily as needed for dizziness.    [provider]  metFORMIN (GLUCOPHAGE) 500 MG tablet Take 500 mg by mouth 2 (two) times daily with a meal.    [provider]  sennosides-docusate sodium (SENOKOT-S) 8.6-50 MG tablet Take 2 tablets by mouth daily.    [provider]    Allergies    Patient has no known allergies.  Review of Systems   Review of Systems  Constitutional:       Per HPI, otherwise negative  HENT:       Per HPI, otherwise negative  Respiratory:       Per HPI, otherwise negative  Cardiovascular:       Per HPI, otherwise negative  Gastrointestinal: Negative for vomiting.  Endocrine:       Negative aside from HPI  Genitourinary:       Neg aside from HPI   Musculoskeletal:       Per HPI, otherwise negative  Skin: Negative.   Neurological: Negative for syncope.    Physical Exam Updated Vital Signs BP (!) 153/88 (BP Location: Left Arm)   Pulse 82   Temp 97.9 F (36.6 C) (Oral)   Resp (!) 21   SpO2 100%   Physical Exam Vitals and nursing note reviewed.  Constitutional:      General: He is not in acute distress.    Appearance: He is well-developed.  HENT:     Head: Normocephalic and atraumatic.  Eyes:     Conjunctiva/sclera: Conjunctivae normal.  Cardiovascular:     Rate and Rhythm: Normal rate and regular rhythm.  Pulmonary:     Effort: Pulmonary effort is normal. No respiratory distress.     Breath sounds: No stridor.  Abdominal:     General: There is no distension.  Skin:    General: Skin is warm and dry.  Neurological:     General: No focal deficit present.     Mental Status: He is alert and oriented to person, place, and time.     Cranial Nerves: No cranial nerve deficit.     Motor: No weakness.     ED Results / Procedures / Treatments   Labs (all labs ordered are listed, but only abnormal results are displayed) Labs Reviewed  BASIC METABOLIC PANEL  CBC  ETHANOL  CK    TROPONIN I (HIGH SENSITIVITY)  TROPONIN I (HIGH SENSITIVITY)    EKG EKG Interpretation  Date/Time:  Sunday April 18 2020 13:18:15 EDT Ventricular Rate:  113 PR Interval:    QRS Duration: 82 QT Interval:  307 QTC Calculation: 421 R Axis:   42 Text Interpretation: Sinus tachycardia Probable left atrial enlargement RSR' in V1 or V2, probably normal variant 12 Lead; Mason-Likar NO sig change fromr March 2020 ecg No STEMI Confirmed by Octaviano Glow [phone]) on 04/18/2020 3:01:18 PM   Radiology DG Chest 2 View  Result Date: 04/18/2020 CLINICAL DATA:  Chest pain. EXAM: CHEST - 2 VIEW COMPARISON:  10/13/2018 FINDINGS: The heart size and mediastinal contours are within normal limits. Both lungs are clear. The visualized skeletal structures are unremarkable. IMPRESSION: Negative.  No active cardiopulmonary disease. Electronically Signed   By: Marlaine Hind M.D.   On: 04/18/2020 13:57    Procedures Procedures (including critical care time)  Medications Ordered in ED Medications  sodium chloride 0.9 % bolus 1,000 mL (1,000 mLs Intravenous New Bag/Given 04/18/20 1606)    ED Course  I have reviewed the triage vital signs and the nursing notes.  Pertinent labs & imaging results that were available during my care of the patient were reviewed by me and considered in my medical decision making (see chart for details).    MDM Rules/Calculators/A&P                          5:19 PM Patient in no distress, awake, alert, sitting upright.  We discussed all findings including 2 unremarkable troponin values, reassuring labs in general.  X-ray reassuring, EKG nonischemic. Some suspicion for the patient's previous symptoms being secondary to his increased alcohol consumption yesterday, no evidence for atypical ACS, PE or other acute new pathology. Patient amenable to, appropriate for discharge with outpatient follow-up. Final Clinical Impression(s) / ED Diagnoses Final diagnoses:  Atypical  chest pain     Carmin Muskrat, MD 04/18/20 1720

## 2020-04-18 NOTE — ED Triage Notes (Signed)
Per pt, states he is having CP and he feels his upper extremities shaking-no other symptoms

## 2020-04-18 NOTE — Discharge Instructions (Signed)
As discussed, your evaluation today has been largely reassuring.  But, it is important that you monitor your condition carefully, and do not hesitate to return to the ED if you develop new, or concerning changes in your condition. ? ?Otherwise, please follow-up with your physician for appropriate ongoing care. ? ?

## 2022-04-05 IMAGING — CR DG CHEST 2V
2 series · 2 of 2 positions shown · non-contrast
Comparison: 10/13/2018

CLINICAL DATA: Chest pain.

EXAM:
CHEST - 2 VIEW

[w chest pa]
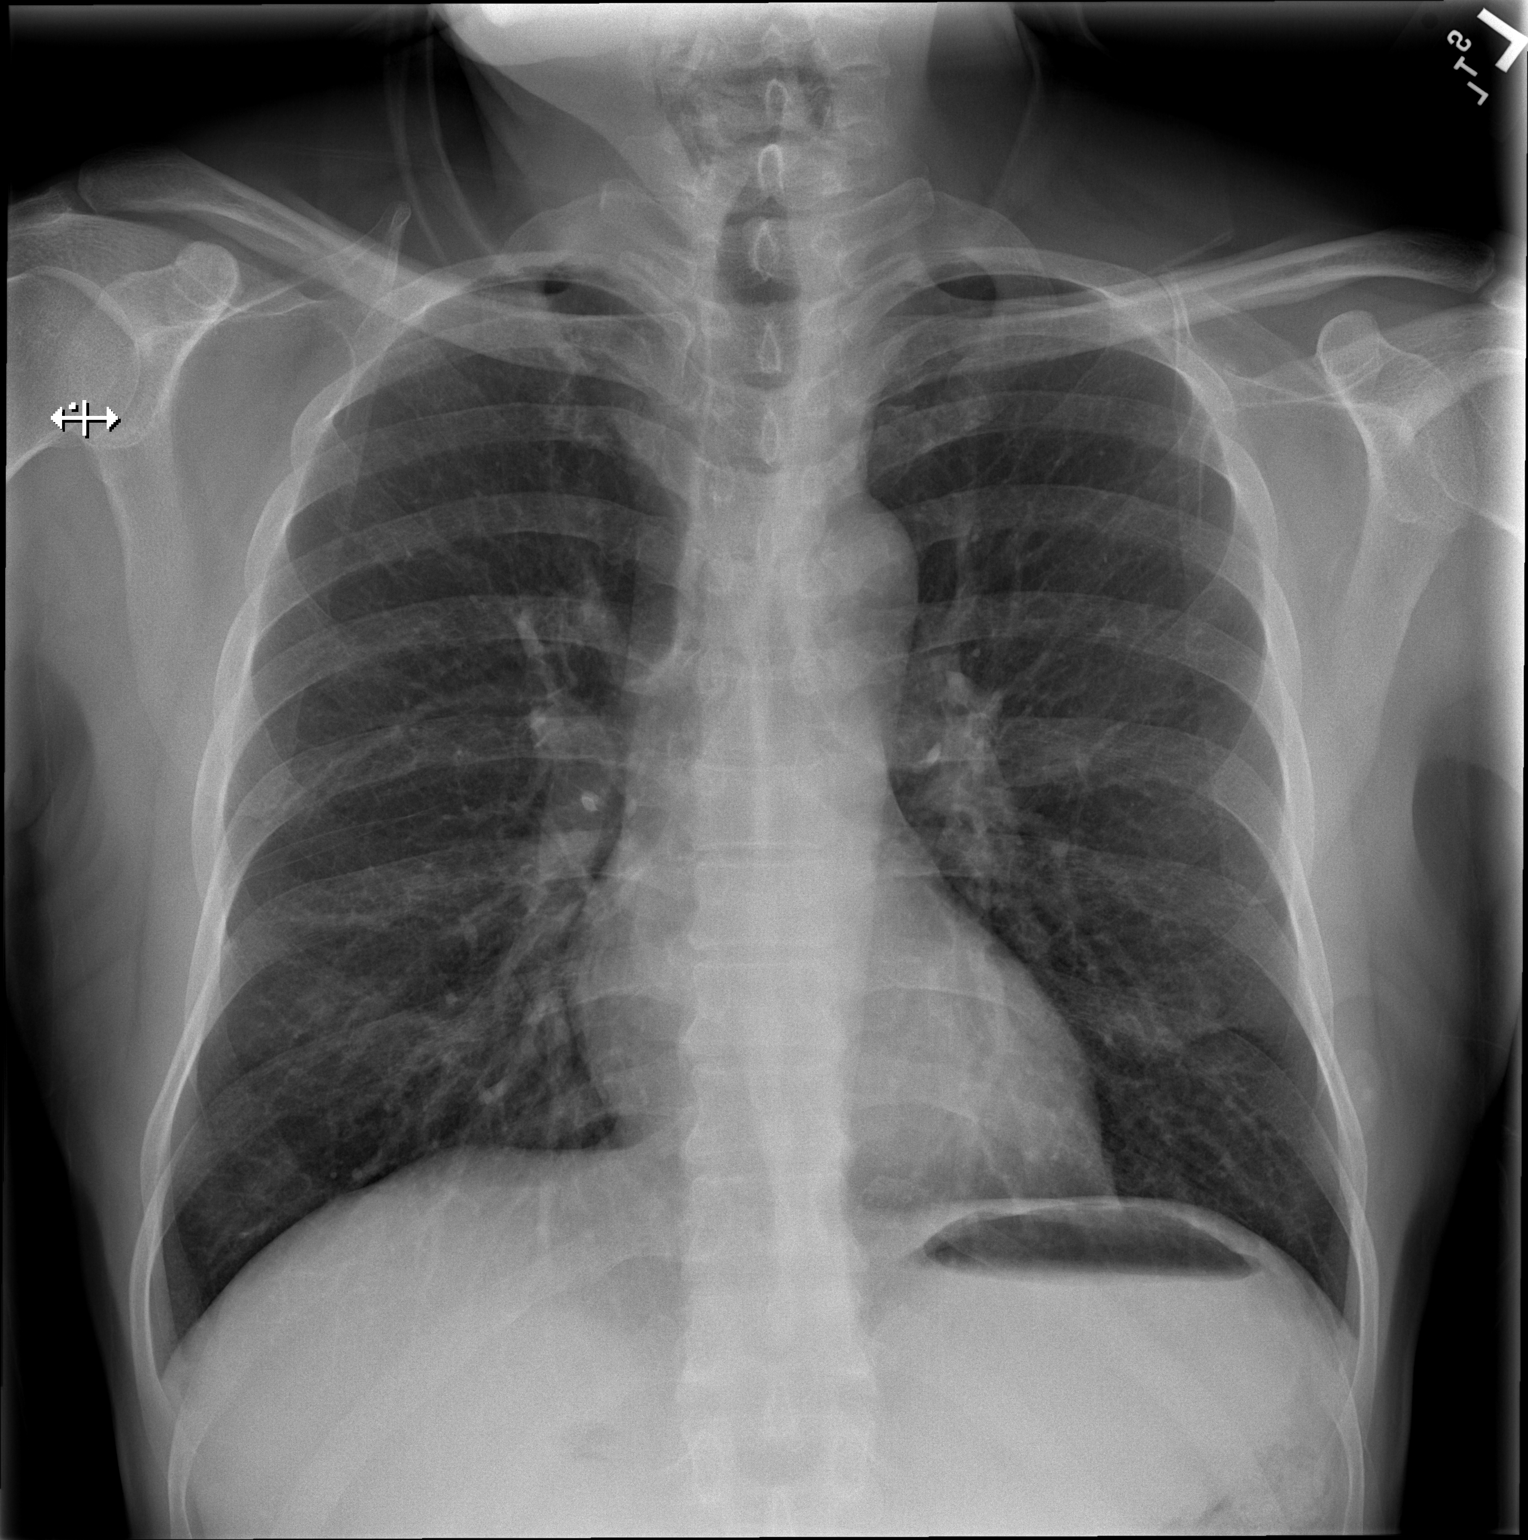

[w chest lat]
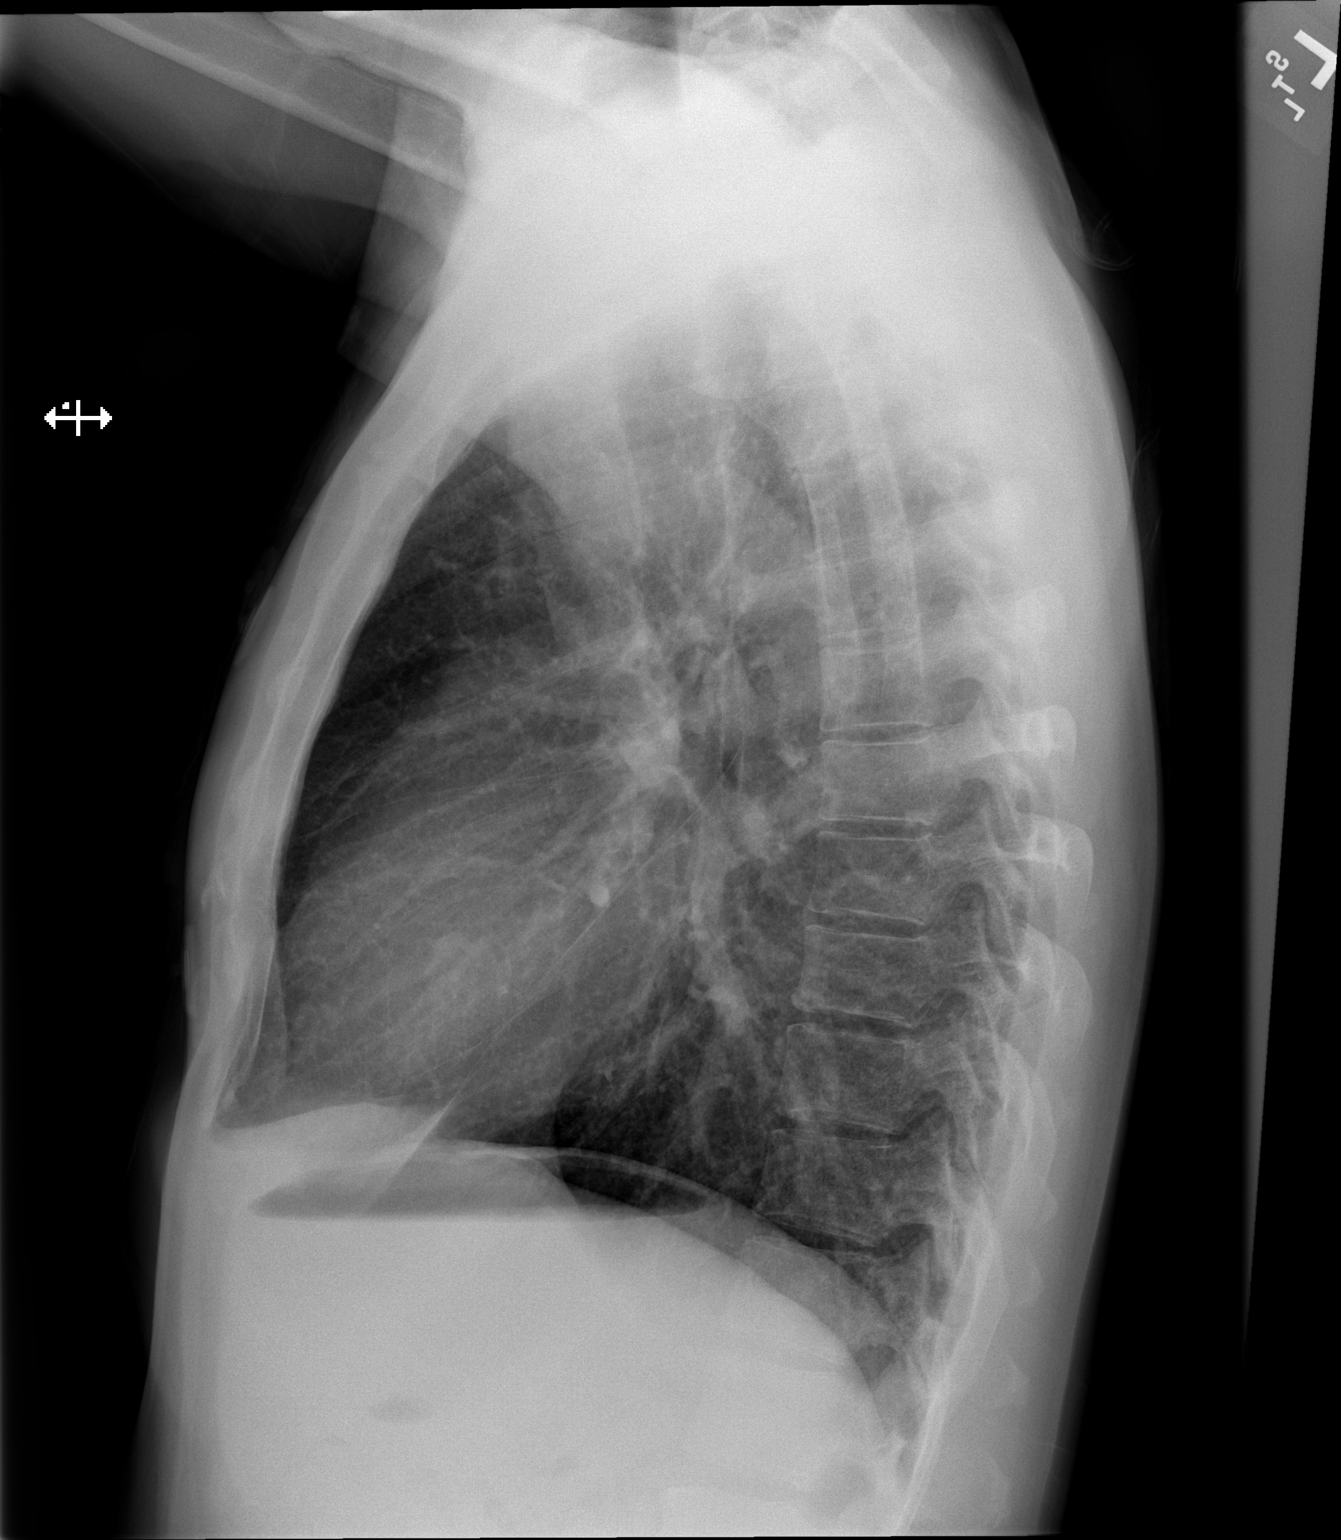

[2 of 2 positions shown; findings below may reference images not displayed]

FINDINGS: The heart size and mediastinal contours are within normal limits.
Both lungs are clear. The visualized skeletal structures are
unremarkable.
IMPRESSION: Negative.  No active cardiopulmonary disease.

## 2022-11-06 ENCOUNTER — Other Ambulatory Visit: Payer: Self-pay | Admitting: Urology

## 2022-11-06 DIAGNOSIS — R310 Gross hematuria: Secondary | ICD-10-CM

## 2022-12-14 ENCOUNTER — Ambulatory Visit
Admission: RE | Admit: 2022-12-14 | Discharge: 2022-12-14 | Disposition: A | Discharge status: Home / Self Care | Payer: Self-pay | Source: Ambulatory Visit | Attending: Urology | Admitting: Urology

## 2022-12-14 DIAGNOSIS — R310 Gross hematuria: Secondary | ICD-10-CM

## 2022-12-14 MED ORDER — IOPAMIDOL (ISOVUE-300) INJECTION 61%
100.0000 mL | Freq: Once | INTRAVENOUS | Status: AC | PRN
  Administered 2022-12-14: 100 mL via INTRAVENOUS

## 2024-03-09 ENCOUNTER — Encounter (HOSPITAL_COMMUNITY): Payer: Self-pay | Admitting: Emergency Medicine

## 2024-03-09 ENCOUNTER — Other Ambulatory Visit: Payer: Self-pay

## 2024-03-09 ENCOUNTER — Emergency Department (HOSPITAL_COMMUNITY)
Admission: EM | Admit: 2024-03-09 | Discharge: 2024-03-09 | Disposition: A | Discharge status: Home / Self Care | Payer: Self-pay | Attending: Emergency Medicine | Admitting: Emergency Medicine

## 2024-03-09 DIAGNOSIS — E119 Type 2 diabetes mellitus without complications: Secondary | ICD-10-CM | POA: Insufficient documentation

## 2024-03-09 DIAGNOSIS — F1990 Other psychoactive substance use, unspecified, uncomplicated: Secondary | ICD-10-CM | POA: Insufficient documentation

## 2024-03-09 DIAGNOSIS — Z7984 Long term (current) use of oral hypoglycemic drugs: Secondary | ICD-10-CM | POA: Insufficient documentation

## 2024-03-09 MED ORDER — ONDANSETRON 4 MG PO TBDP
4.0000 mg | ORAL_TABLET | Freq: Once | ORAL | Status: AC
  Administered 2024-03-09: 4 mg via ORAL
  Filled 2024-03-09: qty 1

## 2024-03-09 NOTE — ED Provider Notes (Signed)
 [place] EMERGENCY DEPARTMENT AT [facility] Provider Note  CSN: [id number] Arrival date & time: 03/09/24 0234  Chief Complaint(s) Drug Problem  HPI Anthony Stanley is a 58 y.o. male with a past medical history listed below who presents to the emergency department for evaluation after doing a bump of what he thought was supposed to be cocaine.  Reports that he felt off 5 minutes after taking a drug.  He reports that he was given this by a girl he was hanging out with them.  He also admits to drinking alcohol.  Denies any other recreational drugs.  Reports that he has done cocaine in the past and never felt this way.  He is unsure of what exactly he was given.  Endorses mild nausea.  No headache.  No visual disturbance.  No focal deficits.  No chest pain or shortness of breath.  The history is provided by the patient.    Past Medical History Past Medical History:  Diagnosis Date   Anxiety    Arthritis    BPH (benign prostatic hyperplasia)    Cocaine use    per Dr Dani Punch office note [facility])   Diabetes mellitus without complication ([facility])    Gallbladder polyp    GERD (gastroesophageal reflux disease)    Hyperlipidemia    Prediabetes    Prostatitis    Substance abuse ([facility])    There are no active problems to display for this patient.  Home Medication(s) Prior to Admission medications   Medication Sig Start Date End Date Taking? Authorizing Provider  meclizine (ANTIVERT) 25 MG tablet Take 25 mg by mouth 3 (three) times daily as needed for dizziness.    [provider]  metFORMIN (GLUCOPHAGE) 500 MG tablet Take 500 mg by mouth 2 (two) times daily with a meal.    [provider]  sennosides-docusate sodium (SENOKOT-S) 8.6-50 MG tablet Take 2 tablets by mouth daily.    [provider]                                                                                                                                     Allergies Patient has no known allergies.  Review of Systems Review of Systems As noted in HPI  Physical Exam Vital Signs  I have reviewed the triage vital signs BP 128/77   Pulse 86   Resp 18   Ht 5' 2 (1.575 m)   Wt 57.3 kg   SpO2 98%   BMI 23.10 kg/m   Physical Exam Vitals reviewed.  Constitutional:      General: He is not in acute distress.    Appearance: He is well-developed. He is not diaphoretic.  HENT:     Head: Normocephalic and atraumatic.     Nose: Nose normal.  Eyes:     General: No scleral icterus.       Right eye: No discharge.  Left eye: No discharge.     Conjunctiva/sclera: Conjunctivae normal.     Pupils: Pupils are equal, round, and reactive to light.  Cardiovascular:     Rate and Rhythm: Normal rate and regular rhythm.     Heart sounds: No murmur heard.    No friction rub. No gallop.  Pulmonary:     Effort: Pulmonary effort is normal. No respiratory distress.     Breath sounds: Normal breath sounds. No stridor. No rales.  Abdominal:     General: There is no distension.     Palpations: Abdomen is soft.     Tenderness: There is no abdominal tenderness.  Musculoskeletal:        General: No tenderness.     Cervical back: Normal range of motion and neck supple.  Skin:    General: Skin is warm and dry.     Findings: No erythema or rash.  Neurological:     Mental Status: He is alert and oriented to person, place, and time.     Comments: Mental Status:  Alert and oriented to person, place, and time.  Attention and concentration normal.  Speech clear.  Recent memory is intact  Cranial Nerves:  II Visual Fields: Intact to confrontation. Visual fields intact. III, IV, VI: Pupils equal and reactive to light and near. Full eye movement without nystagmus  V Facial Sensation: Normal. No weakness of masticatory muscles  VII: No facial weakness or asymmetry  VIII Auditory Acuity: Grossly normal  IX/X: The uvula is midline; the palate elevates  symmetrically  XI: Normal sternocleidomastoid and trapezius strength  XII: The tongue is midline. No atrophy or fasciculations.   Motor System: Muscle Strength: 5/5 and symmetric in the upper and lower extremities. No pronation or drift.  Muscle Tone: Tone and muscle bulk are normal in the upper and lower extremities.  Coordination: Intact finger-to-nose. No tremor.  Sensation: Intact to light touch.  Gait: Routine gait normal.        ED Results and Treatments Labs (all labs ordered are listed, but only abnormal results are displayed) Labs Reviewed - No data to display                                                                                                                       EKG  EKG Interpretation Date/Time:    Ventricular Rate:    PR Interval:    QRS Duration:    QT Interval:    QTC Calculation:   R Axis:      Text Interpretation:         Radiology No results found.  Medications Ordered in ED Medications  ondansetron  (ZOFRAN -ODT) disintegrating tablet 4 mg (has no administration in time range)   Procedures Procedures  (including critical care time) Medical Decision Making / ED Course   Medical Decision Making Risk Prescription drug management.    The patient appears well, in no acute distress, without evidence of toxicity or dehydration.  No focal deficits on  exam.  Concern for significant electrolyte/metabolic derangements or other serious processes is low at this time.  No need for imaging or workup. He has been stable for over 3 hrs while in the ER.      Final Clinical Impression(s) / ED Diagnoses Final diagnoses:  Drug use   The patient appears reasonably screened and/or stabilized for discharge and I doubt any other medical condition or other [facility] requiring further screening, evaluation, or treatment in the ED at this time. I have discussed the findings, Dx and Tx plan with the patient/family who expressed understanding and agree(s) with  the plan. Discharge instructions discussed at length. The patient/family was given strict return precautions who verbalized understanding of the instructions. No further questions at time of discharge.  Disposition: Discharge  Condition: Good  ED Discharge Orders     None         Follow Up: Primary care provider  Call  to schedule an appointment for close follow up     This chart was dictated using voice recognition software.  Despite best efforts to proofread,  errors can occur which can change the documentation meaning.    Trine Raynell Moder, MD 03/09/24 [phone]

## 2024-03-09 NOTE — ED Triage Notes (Signed)
 Pt presents to the ED via [facility] with complaints of doing a bump of cocaine an hour ago from a stranger and he wanted a medical eval. He notes this isnt the first time he has used cocaine. Pt has no complaints at this time. VSS with EMS. A&Ox4 at this time. Denies CP or SOB.

## 2024-03-26 ENCOUNTER — Telehealth: Payer: Self-pay | Admitting: Gastroenterology

## 2024-03-26 NOTE — Telephone Encounter (Signed)
 Doyal with [facility] medical office is calling to have the pathology report from 07/01/2019 sent to [phone] attn: Doyal. Please advise.

## 2024-03-27 NOTE — Telephone Encounter (Signed)
 Report has been faxed to [facility] per request

## 2024-04-08 NOTE — Telephone Encounter (Signed)
 Report has been faxed.

## 2024-04-08 NOTE — Telephone Encounter (Signed)
 Inbound call from [facility] states they received pathology report but not results. Please advise, thank you

## 2024-07-21 ENCOUNTER — Emergency Department (HOSPITAL_COMMUNITY): Payer: Self-pay

## 2024-07-21 ENCOUNTER — Other Ambulatory Visit: Payer: Self-pay

## 2024-07-21 ENCOUNTER — Emergency Department (HOSPITAL_COMMUNITY)
Admission: EM | Admit: 2024-07-21 | Discharge: 2024-07-21 | Discharge status: Against Medical Advice | Payer: Self-pay | Attending: Emergency Medicine | Admitting: Emergency Medicine

## 2024-07-21 DIAGNOSIS — Z5321 Procedure and treatment not carried out due to patient leaving prior to being seen by health care provider: Secondary | ICD-10-CM | POA: Insufficient documentation

## 2024-07-21 DIAGNOSIS — R002 Palpitations: Secondary | ICD-10-CM | POA: Insufficient documentation

## 2024-07-21 DIAGNOSIS — R0602 Shortness of breath: Secondary | ICD-10-CM | POA: Insufficient documentation

## 2024-07-21 LAB — CBC
HCT: 40.2 % (ref 39.0–52.0)
Hemoglobin: 13.7 g/dL (ref 13.0–17.0)
MCH: 30.9 pg (ref 26.0–34.0)
MCHC: 34.1 g/dL (ref 30.0–36.0)
MCV: 90.7 fL (ref 80.0–100.0)
Platelets: 259 K/uL (ref 150–400)
RBC: 4.43 MIL/uL (ref 4.22–5.81)
RDW: 13.3 % (ref 11.5–15.5)
WBC: 7.3 K/uL (ref 4.0–10.5)
nRBC: 0 % (ref 0.0–0.2)

## 2024-07-21 LAB — BASIC METABOLIC PANEL WITH GFR
Anion gap: 14 (ref 5–15)
BUN: 7 mg/dL (ref 6–20)
CO2: 22 mmol/L (ref 22–32)
Calcium: 8.7 mg/dL — ABNORMAL LOW (ref 8.9–10.3)
Chloride: 96 mmol/L — ABNORMAL LOW (ref 98–111)
Creatinine, Ser: 0.66 mg/dL (ref 0.61–1.24)
GFR, Estimated: >60 mL/min
Glucose, Bld: 110 mg/dL — ABNORMAL HIGH (ref 70–99)
Potassium: 3.1 mmol/L — ABNORMAL LOW (ref 3.5–5.1)
Sodium: 132 mmol/L — ABNORMAL LOW (ref 135–145)

## 2024-07-21 LAB — TROPONIN T, HIGH SENSITIVITY: Troponin T High Sensitivity: <15 ng/L (ref 0–19)

## 2024-07-21 NOTE — ED Notes (Signed)
 Pt called x3 for repeat trop, no response.

## 2024-07-21 NOTE — ED Triage Notes (Signed)
 Patient reports intermittent palpitations this evening with SOB , denies emesis or diaphoresis .

## 2024-07-21 NOTE — ED Notes (Signed)
Pt called for vitals no response x3 ° °
